# Patient Record
Sex: Male | Born: 1975 | Race: White | Hispanic: No | Smoking: Never smoker
Health system: Southern US, Community
[De-identification: ages and names within clinical notes are randomized; demographics above are authoritative.]

## PROBLEM LIST (undated history)

## (undated) DIAGNOSIS — I10 Essential (primary) hypertension: Secondary | ICD-10-CM

## (undated) DIAGNOSIS — M549 Dorsalgia, unspecified: Secondary | ICD-10-CM

## (undated) DIAGNOSIS — S3992XA Unspecified injury of lower back, initial encounter: Secondary | ICD-10-CM

---

## 2005-04-16 ENCOUNTER — Ambulatory Visit (HOSPITAL_COMMUNITY): Admission: RE | Admit: 2005-04-16 | Discharge: 2005-04-16 | Payer: Self-pay | Admitting: Family Medicine

## 2005-05-23 ENCOUNTER — Ambulatory Visit (HOSPITAL_COMMUNITY): Admission: RE | Admit: 2005-05-23 | Discharge: 2005-05-23 | Payer: Self-pay | Admitting: Family Medicine

## 2006-02-18 ENCOUNTER — Ambulatory Visit (HOSPITAL_COMMUNITY): Admission: RE | Admit: 2006-02-18 | Discharge: 2006-02-18 | Payer: Self-pay | Admitting: Family Medicine

## 2006-04-13 ENCOUNTER — Emergency Department (HOSPITAL_COMMUNITY): Admission: EM | Admit: 2006-04-13 | Discharge: 2006-04-13 | Payer: Self-pay | Admitting: Emergency Medicine

## 2012-07-24 ENCOUNTER — Encounter (HOSPITAL_COMMUNITY): Payer: Self-pay | Admitting: Emergency Medicine

## 2012-07-24 ENCOUNTER — Emergency Department (HOSPITAL_COMMUNITY)
Admission: EM | Admit: 2012-07-24 | Discharge: 2012-07-24 | Disposition: A | Discharge status: Home / Self Care | Payer: Self-pay | Attending: Emergency Medicine | Admitting: Emergency Medicine

## 2012-07-24 DIAGNOSIS — G8929 Other chronic pain: Secondary | ICD-10-CM | POA: Insufficient documentation

## 2012-07-24 DIAGNOSIS — M546 Pain in thoracic spine: Secondary | ICD-10-CM | POA: Insufficient documentation

## 2012-07-24 DIAGNOSIS — Z87828 Personal history of other (healed) physical injury and trauma: Secondary | ICD-10-CM | POA: Insufficient documentation

## 2012-07-24 HISTORY — DX: Dorsalgia, unspecified: M54.9

## 2012-07-24 HISTORY — DX: Unspecified injury of lower back, initial encounter: S39.92XA

## 2012-07-24 MED ORDER — CYCLOBENZAPRINE HCL 10 MG PO TABS: 10.0000 mg | ORAL_TABLET | Freq: Two times a day (BID) | ORAL | Status: DC | PRN

## 2012-07-24 MED ORDER — OXYCODONE-ACETAMINOPHEN 5-325 MG PO TABS: 2.0000 | ORAL_TABLET | ORAL | Status: DC | PRN

## 2012-07-24 MED ORDER — PREDNISONE (PAK) 10 MG PO TABS: 10.0000 mg | ORAL_TABLET | Freq: Every day | ORAL | Status: DC

## 2012-07-24 MED ORDER — PREDNISONE 50 MG PO TABS
60.0000 mg | ORAL_TABLET | Freq: Once | ORAL | Status: AC
  Administered 2012-07-24: 60 mg via ORAL
  Filled 2012-07-24: qty 1

## 2012-07-24 MED ORDER — KETOROLAC TROMETHAMINE 60 MG/2ML IM SOLN
60.0000 mg | Freq: Once | INTRAMUSCULAR | Status: AC
  Administered 2012-07-24: 60 mg via INTRAMUSCULAR
  Filled 2012-07-24: qty 2

## 2012-07-24 MED ORDER — CYCLOBENZAPRINE HCL 10 MG PO TABS
10.0000 mg | ORAL_TABLET | Freq: Once | ORAL | Status: AC
  Administered 2012-07-24: 10 mg via ORAL
  Filled 2012-07-24: qty 1

## 2012-07-24 NOTE — ED Provider Notes (Signed)
History     CSN: [id number]  Arrival date & time 07/24/12  7829   First MD Initiated Contact with Patient 07/24/12 [phone]      Chief Complaint  Patient presents with  . Back Pain    (Consider location/radiation/quality/duration/timing/severity/associated sxs/prior treatment) Patient is a 37 y.o. male presenting with back pain. The history is provided by the patient.  Back Pain Location:  Thoracic spine Quality:  Stabbing, shooting and burning Radiates to:  Does not radiate Pain severity:  Moderate Pain is:  Same all the time Onset quality:  Gradual Duration:  4 days Timing:  Constant Progression:  Worsening Chronicity:  New Relieved by:  Nothing Ineffective treatments:  Ibuprofen, narcotics, lying down, heating pad and being still Associated symptoms: no abdominal pain, no bladder incontinence, no bowel incontinence, no chest pain, no dysuria, no fever, no headaches, no leg pain, no numbness, no tingling, no weakness and no weight loss    Edu BERT PTACEK is a 37 y.o. male who presents to the ED with low back pain. He has a history of chronic back pain. He states he does well for a while and then the pain returns. At one time he had an injection in his back that helped for a long time.   Past Medical History  Diagnosis Date  . Back pain   . Back injury     No past surgical history on file.  No family history on file.  History  Substance Use Topics  . Smoking status: Not on file  . Smokeless tobacco: Not on file  . Alcohol Use: Not on file      Review of Systems  Constitutional: Negative for fever and weight loss.  Cardiovascular: Negative for chest pain.  Gastrointestinal: Negative for nausea, vomiting, abdominal pain and bowel incontinence.  Genitourinary: Negative for bladder incontinence and dysuria.  Musculoskeletal: Positive for back pain.  Skin: Negative for rash.  Neurological: Negative for tingling, weakness, numbness and headaches.   Psychiatric/Behavioral: The patient is not nervous/anxious.     Allergies  Review of patient's allergies indicates no known allergies.  Home Medications   Current Outpatient Rx  Name  Route  Sig  Dispense  Refill  . oxyCODONE-acetaminophen (PERCOCET) 10-325 MG per tablet   Oral   Take 1 tablet by mouth every 4 (four) hours as needed for pain.           BP 149/100  Pulse 84  Temp(Src) 97.7 F (36.5 C) (Oral)  Resp 16  Ht 6' (1.829 m)  Wt 185 lb (83.915 kg)  BMI 25.08 kg/m2  SpO2 100%  Physical Exam  Nursing note and vitals reviewed. Constitutional: He is oriented to person, place, and time. He appears well-developed and well-nourished.  HENT:  Head: Normocephalic and atraumatic.  Eyes: EOM are normal.  Neck: Normal range of motion. Neck supple.  Cardiovascular: Normal rate and regular rhythm.   Pulmonary/Chest: Effort normal and breath sounds normal.  Abdominal: Soft. Bowel sounds are normal. There is no tenderness.  Musculoskeletal: Normal range of motion. He exhibits no edema.       Thoracic back: He exhibits tenderness and spasm. He exhibits normal range of motion.  There is tenderness with palpation of the thoracic area.   Neurological: He is alert and oriented to person, place, and time. He has normal strength. No cranial nerve deficit or sensory deficit.  Reflex Scores:      Bicep reflexes are 2+ on the right side and 2+ on the  left side.      Brachioradialis reflexes are 2+ on the right side and 2+ on the left side.      Patellar reflexes are 2+ on the right side and 2+ on the left side. Pedal pulses strong and equal. Adequate circulation, good touch sensation.  Skin: Skin is warm and dry.  Psychiatric: He has a normal mood and affect.    ED Course  Procedures (including critical care time)  MDM  37 y.o. male with thoracic back pain. Hx of same in the past. I have reviewed this patient's vital signs, nurses notes and discussed clinical findings and plan  of care with the patient. He voices understanding. He will follow up with ortho. He will continue his oxycodone as needed and take the prednisone and flexeril until he sees ortho.    Medication List    STOP taking these medications       acetaminophen 500 MG tablet  Commonly known as:  TYLENOL     ibuprofen 200 MG tablet  Commonly known as:  ADVIL,MOTRIN      TAKE these medications       cyclobenzaprine 10 MG tablet  Commonly known as:  FLEXERIL  Take 1 tablet (10 mg total) by mouth 2 (two) times daily as needed for muscle spasms.     predniSONE 10 MG tablet  Commonly known as:  STERAPRED UNI-PAK  Take 1 tablet (10 mg total) by mouth daily. Starting tomorrow take 5 tablets PO, then the next day take 4 tablets, then 3, 2, 1      ASK your doctor about these medications       diazepam 10 MG tablet  Commonly known as:  VALIUM  Take 10 mg by mouth every 6 (six) hours as needed for anxiety.     oxyCODONE-acetaminophen 10-325 MG per tablet  Commonly known as:  PERCOCET  Take 1 tablet by mouth every 4 (four) hours as needed for pain.            Janne Napoleon, [place] 07/24/12 1029

## 2012-07-24 NOTE — ED Provider Notes (Signed)
Medical screening examination/treatment/procedure(s) were performed by non-physician practitioner and as supervising physician I was immediately available for consultation/collaboration.   Charles B. Bernette Mayers, MD 07/24/12 1256

## 2012-07-24 NOTE — ED Notes (Signed)
States that he is out of his pain medications and his back pain is worse than usual.

## 2012-10-14 ENCOUNTER — Emergency Department (HOSPITAL_COMMUNITY)
Admission: EM | Admit: 2012-10-14 | Discharge: 2012-10-14 | Disposition: A | Discharge status: Home / Self Care | Payer: Self-pay | Attending: Emergency Medicine | Admitting: Emergency Medicine

## 2012-10-14 ENCOUNTER — Encounter (HOSPITAL_COMMUNITY): Payer: Self-pay | Admitting: *Deleted

## 2012-10-14 DIAGNOSIS — R21 Rash and other nonspecific skin eruption: Secondary | ICD-10-CM | POA: Insufficient documentation

## 2012-10-14 DIAGNOSIS — R55 Syncope and collapse: Secondary | ICD-10-CM | POA: Insufficient documentation

## 2012-10-14 DIAGNOSIS — Z87828 Personal history of other (healed) physical injury and trauma: Secondary | ICD-10-CM | POA: Insufficient documentation

## 2012-10-14 DIAGNOSIS — R61 Generalized hyperhidrosis: Secondary | ICD-10-CM | POA: Insufficient documentation

## 2012-10-14 DIAGNOSIS — R51 Headache: Secondary | ICD-10-CM | POA: Insufficient documentation

## 2012-10-14 DIAGNOSIS — R110 Nausea: Secondary | ICD-10-CM | POA: Insufficient documentation

## 2012-10-14 DIAGNOSIS — T7840XA Allergy, unspecified, initial encounter: Secondary | ICD-10-CM

## 2012-10-14 LAB — CBC WITH DIFFERENTIAL/PLATELET
Eosinophils Relative: 1 % (ref 0–5)
HCT: 46 % (ref 39.0–52.0)
Hemoglobin: 15.3 g/dL (ref 13.0–17.0)
Lymphocytes Relative: 5 % — ABNORMAL LOW (ref 12–46)
MCH: 31 pg (ref 26.0–34.0)
Monocytes Relative: 3 % (ref 3–12)
Neutrophils Relative %: 90 % — ABNORMAL HIGH (ref 43–77)
RBC: 4.93 MIL/uL (ref 4.22–5.81)
RDW: 12.5 % (ref 11.5–15.5)
WBC: 15.9 10*3/uL — ABNORMAL HIGH (ref 4.0–10.5)

## 2012-10-14 LAB — BASIC METABOLIC PANEL
BUN: 19 mg/dL (ref 6–23)
CO2: 24 mEq/L (ref 19–32)
Chloride: 102 mEq/L (ref 96–112)
Creatinine, Ser: 1.22 mg/dL (ref 0.50–1.35)
GFR calc non Af Amer: 74 mL/min — ABNORMAL LOW (ref >90)
Glucose, Bld: 116 mg/dL — ABNORMAL HIGH (ref 70–99)
Sodium: 134 mEq/L — ABNORMAL LOW (ref 135–145)

## 2012-10-14 MED ORDER — METHYLPREDNISOLONE SODIUM SUCC 125 MG IJ SOLR
125.0000 mg | Freq: Once | INTRAMUSCULAR | Status: AC
  Administered 2012-10-14: 125 mg via INTRAVENOUS
  Filled 2012-10-14: qty 2

## 2012-10-14 MED ORDER — FAMOTIDINE 20 MG PO TABS: 20.0000 mg | ORAL_TABLET | Freq: Two times a day (BID) | ORAL | Status: DC

## 2012-10-14 MED ORDER — FAMOTIDINE IN NACL 20-0.9 MG/50ML-% IV SOLN
20.0000 mg | Freq: Once | INTRAVENOUS | Status: AC
  Administered 2012-10-14: 20 mg via INTRAVENOUS
  Filled 2012-10-14: qty 50

## 2012-10-14 MED ORDER — PREDNISONE 20 MG PO TABS: ORAL_TABLET | ORAL | Status: DC

## 2012-10-14 MED ORDER — SODIUM CHLORIDE 0.9 % IV BOLUS (SEPSIS)
1000.0000 mL | Freq: Once | INTRAVENOUS | Status: AC
  Administered 2012-10-14: 1000 mL via INTRAVENOUS

## 2012-10-14 MED ORDER — DIPHENHYDRAMINE HCL 50 MG/ML IJ SOLN
25.0000 mg | Freq: Once | INTRAMUSCULAR | Status: AC
  Administered 2012-10-14: 25 mg via INTRAVENOUS
  Filled 2012-10-14: qty 1

## 2012-10-14 NOTE — Progress Notes (Signed)
ED/CM noted pt did not have health insurance, and/or PCP. Patient was given the ED  [facility] uninsured handout  with information for the clinics, food pantries, and the handout for insurance signup.  Patient expressed  appreciation for this.

## 2012-10-14 NOTE — ED Provider Notes (Signed)
CSN: [id number]     Arrival date & time 10/14/12  0703 History  This chart was scribed for Donnetta Hutching, MD, by Yevette Edwards, ED Scribe. This patient was seen in room APA06/APA06 and the patient's care was started at 7:17 AM.   First MD Initiated Contact with Patient 10/14/12 [phone]     Chief Complaint  Patient presents with  . Near Syncope  . Allergic Reaction    The history is provided by the patient and the spouse. No language interpreter was used.   HPI Comments: Derek Holmes is a 37 y.o. male who presents to the Emergency Department complaining of an acute episode of near syncope which occurred suddenly this morning as he was standing at the commode. He experienced nausea and diaphoresis in addition to the syncope. He denies experiencing any pain associated with the near syncope. The pt states that for the past three days, he has also experienced a diffuse rash along with constant itching and swelling to several affected sites including his right thigh, right buttocks, abdomens, legs bilaterally, and hands bilaterally. He states that one of the sites on his right thigh started as a "big boil" with a red ring around it. The pt's wife reports that the rash seems to be spreading and is now also present on his face, scalp, and bilateral arms. The pt works outside, and he states that he has a history of being bitten by insects including ticks. He denies being aware of any recent tick bites, though.  His wife also states that he has had a higher BP than usual for the past two weeks, with the max being 167/107; in the ED his BP was 110/75. They were checking his BP at home because the pt had been experiencing headaches. He has a h/o stress-induced hypertension.   Past Medical History  Diagnosis Date  . Back pain   . Back injury    History reviewed. No pertinent past surgical history. History reviewed. No pertinent family history. History  Substance Use Topics  . Smoking status: Not on file   . Smokeless tobacco: Not on file  . Alcohol Use: Not on file    Review of Systems  Constitutional: Positive for diaphoresis.  Cardiovascular: Negative for chest pain.  Gastrointestinal: Positive for nausea. Negative for abdominal pain.  Genitourinary: Negative for dysuria.  Musculoskeletal: Negative for back pain.  Skin: Positive for rash.  Neurological: Positive for syncope (Pt states it was a "near" syncope) and headaches.  All other systems reviewed and are negative.    Allergies  Review of patient's allergies indicates no known allergies.  Home Medications   Current Outpatient Rx  Name  Route  Sig  Dispense  Refill  . cyclobenzaprine (FLEXERIL) 10 MG tablet   Oral   Take 1 tablet (10 mg total) by mouth 2 (two) times daily as needed for muscle spasms.   20 tablet   0   . diazepam (VALIUM) 10 MG tablet   Oral   Take 10 mg by mouth every 6 (six) hours as needed for anxiety.         Marland Kitchen oxyCODONE-acetaminophen (PERCOCET) 10-325 MG per tablet   Oral   Take 1 tablet by mouth every 4 (four) hours as needed for pain.         Marland Kitchen oxyCODONE-acetaminophen (PERCOCET/ROXICET) 5-325 MG per tablet   Oral   Take 2 tablets by mouth every 4 (four) hours as needed for pain.   15 tablet  0   . predniSONE (STERAPRED UNI-PAK) 10 MG tablet   Oral   Take 1 tablet (10 mg total) by mouth daily. Starting tomorrow take 5 tablets PO, then the next day take 4 tablets, then 3, 2, 1   15 tablet   0    Triage Vitals: BP 110/75  Pulse 85  Temp(Src) 98.7 F (37.1 C) (Oral)  Resp 20  Ht 6' (1.829 m)  Wt 175 lb (79.379 kg)  BMI 23.73 kg/m2  SpO2 98%  Physical Exam  Nursing note and vitals reviewed. Constitutional: He is oriented to person, place, and time. He appears well-developed and well-nourished. No distress.  HENT:  Head: Normocephalic and atraumatic.  Eyes: EOM are normal.  Neck: Neck supple. No tracheal deviation present.  Cardiovascular: Normal rate, regular rhythm and  normal heart sounds.   Pulmonary/Chest: Effort normal and breath sounds normal. No respiratory distress.  Musculoskeletal: Normal range of motion.  Neurological: He is alert and oriented to person, place, and time.  Skin: Skin is warm and dry. Rash noted.  Difusse papular erythematous blotchy rash with some scabbing on right leg.  Psychiatric: He has a normal mood and affect. His behavior is normal.    ED Course   DIAGNOSTIC STUDIES:  Oxygen Saturation is 98% on room air, normal by my interpretation.    COORDINATION OF CARE:  7:27 AM- Discussed treatment plan with patient which includes IV medication, lab work, and imaging. The patient agreed to the plan.   Procedures (including critical care time)  Labs Reviewed  CBC WITH DIFFERENTIAL - Abnormal; Notable for the following:    WBC 15.9 (*)    Neutrophils Relative % 90 (*)    Neutro Abs 14.4 (*)    Lymphocytes Relative 5 (*)    All other components within normal limits  BASIC METABOLIC PANEL - Abnormal; Notable for the following:    Sodium 134 (*)    Glucose, Bld 116 (*)    GFR calc non Af Amer 74 (*)    GFR calc Af Amer 86 (*)    All other components within normal limits   No results found. No diagnosis found.    Date: 10/14/2012  Rate: 79  Rhythm: normal sinus rhythm  QRS Axis: normal  Intervals: normal  ST/T Wave abnormalities: normal  Conduction Disutrbances: none  Narrative Interpretation: unremarkable    MDM  Uncertain etiology of allergic phenomena. EKG and hemoglobin stable.  Patient feels better after IV Solu Medrol, Benadryl, Pepcid. Discharge meds include prednisone taper, Pepcid, daily Claritin.  Patient has primary care followup I personally performed the services described in this documentation, which was scribed in my presence. The recorded information has been reviewed and is accurate.    Donnetta Hutching, MD 10/14/12 1037

## 2012-10-14 NOTE — ED Notes (Signed)
Pt c/o hives, itching all over, swelling to bilateral hands that started 2-3 days ago, states that while he was standing at the commode urinating this am he felt like he was going to pass out, weak, became "sweaty" nauseous.. Denies any pain

## 2014-02-24 ENCOUNTER — Other Ambulatory Visit (HOSPITAL_COMMUNITY): Payer: Self-pay | Admitting: Physician Assistant

## 2014-02-24 DIAGNOSIS — G609 Hereditary and idiopathic neuropathy, unspecified: Secondary | ICD-10-CM

## 2014-02-24 DIAGNOSIS — M5417 Radiculopathy, lumbosacral region: Secondary | ICD-10-CM

## 2014-02-24 DIAGNOSIS — M7541 Impingement syndrome of right shoulder: Secondary | ICD-10-CM

## 2014-03-03 ENCOUNTER — Other Ambulatory Visit (HOSPITAL_COMMUNITY): Payer: Self-pay

## 2014-03-11 ENCOUNTER — Ambulatory Visit (HOSPITAL_COMMUNITY)
Admission: RE | Admit: 2014-03-11 | Discharge: 2014-03-11 | Disposition: A | Discharge status: Home / Self Care | Payer: 59 | Source: Ambulatory Visit | Attending: Physician Assistant | Admitting: Physician Assistant

## 2014-03-11 DIAGNOSIS — M7541 Impingement syndrome of right shoulder: Secondary | ICD-10-CM

## 2014-03-11 DIAGNOSIS — M5417 Radiculopathy, lumbosacral region: Secondary | ICD-10-CM

## 2014-03-11 DIAGNOSIS — M545 Low back pain: Secondary | ICD-10-CM | POA: Diagnosis present

## 2014-03-11 DIAGNOSIS — M79605 Pain in left leg: Secondary | ICD-10-CM | POA: Diagnosis not present

## 2014-03-11 DIAGNOSIS — G609 Hereditary and idiopathic neuropathy, unspecified: Secondary | ICD-10-CM

## 2014-03-11 DIAGNOSIS — M5126 Other intervertebral disc displacement, lumbar region: Secondary | ICD-10-CM | POA: Insufficient documentation

## 2018-04-16 ENCOUNTER — Emergency Department (HOSPITAL_COMMUNITY)
Admission: EM | Admit: 2018-04-16 | Discharge: 2018-04-16 | Disposition: A | Discharge status: Home / Self Care | Payer: Managed Care, Other (non HMO) | Attending: Emergency Medicine | Admitting: Emergency Medicine

## 2018-04-16 ENCOUNTER — Other Ambulatory Visit: Payer: Self-pay

## 2018-04-16 ENCOUNTER — Emergency Department (HOSPITAL_COMMUNITY): Payer: Managed Care, Other (non HMO)

## 2018-04-16 ENCOUNTER — Encounter (HOSPITAL_COMMUNITY): Payer: Self-pay | Admitting: Emergency Medicine

## 2018-04-16 DIAGNOSIS — Y9389 Activity, other specified: Secondary | ICD-10-CM | POA: Insufficient documentation

## 2018-04-16 DIAGNOSIS — Z79899 Other long term (current) drug therapy: Secondary | ICD-10-CM | POA: Diagnosis not present

## 2018-04-16 DIAGNOSIS — S39012A Strain of muscle, fascia and tendon of lower back, initial encounter: Secondary | ICD-10-CM | POA: Diagnosis not present

## 2018-04-16 DIAGNOSIS — X509XXA Other and unspecified overexertion or strenuous movements or postures, initial encounter: Secondary | ICD-10-CM | POA: Diagnosis not present

## 2018-04-16 DIAGNOSIS — S3992XA Unspecified injury of lower back, initial encounter: Secondary | ICD-10-CM | POA: Diagnosis present

## 2018-04-16 DIAGNOSIS — Y999 Unspecified external cause status: Secondary | ICD-10-CM | POA: Insufficient documentation

## 2018-04-16 DIAGNOSIS — Y92007 Garden or yard of unspecified non-institutional (private) residence as the place of occurrence of the external cause: Secondary | ICD-10-CM | POA: Insufficient documentation

## 2018-04-16 DIAGNOSIS — I10 Essential (primary) hypertension: Secondary | ICD-10-CM | POA: Diagnosis not present

## 2018-04-16 HISTORY — DX: Essential (primary) hypertension: I10

## 2018-04-16 MED ORDER — CYCLOBENZAPRINE HCL 10 MG PO TABS: 10.0000 mg | ORAL_TABLET | Freq: Two times a day (BID) | ORAL | 0 refills | Status: AC | PRN

## 2018-04-16 MED ORDER — HYDROCODONE-ACETAMINOPHEN 5-325 MG PO TABS: 1.0000 | ORAL_TABLET | ORAL | 0 refills | Status: AC | PRN

## 2018-04-16 MED ORDER — HYDROMORPHONE HCL 1 MG/ML IJ SOLN
1.0000 mg | Freq: Once | INTRAMUSCULAR | Status: AC
  Administered 2018-04-16: 1 mg via INTRAMUSCULAR
  Filled 2018-04-16: qty 1

## 2018-04-16 MED ORDER — PREDNISONE 10 MG PO TABS
60.0000 mg | ORAL_TABLET | Freq: Once | ORAL | Status: AC
  Administered 2018-04-16: 60 mg via ORAL
  Filled 2018-04-16: qty 1

## 2018-04-16 MED ORDER — PREDNISONE 10 MG PO TABS: ORAL_TABLET | ORAL | 0 refills | Status: AC

## 2018-04-16 MED ORDER — ONDANSETRON 4 MG PO TBDP
4.0000 mg | ORAL_TABLET | Freq: Once | ORAL | Status: AC
  Administered 2018-04-16: 4 mg via ORAL
  Filled 2018-04-16: qty 1

## 2018-04-16 MED ORDER — KETOROLAC TROMETHAMINE 30 MG/ML IJ SOLN
15.0000 mg | Freq: Once | INTRAMUSCULAR | Status: AC
  Administered 2018-04-16: 15 mg via INTRAVENOUS
  Filled 2018-04-16: qty 1

## 2018-04-16 MED ORDER — CYCLOBENZAPRINE HCL 10 MG PO TABS
10.0000 mg | ORAL_TABLET | Freq: Once | ORAL | Status: AC
  Administered 2018-04-16: 10 mg via ORAL
  Filled 2018-04-16: qty 1

## 2018-04-16 MED ORDER — HYDROMORPHONE HCL 1 MG/ML IJ SOLN
1.0000 mg | Freq: Once | INTRAMUSCULAR | Status: AC
  Administered 2018-04-16: 1 mg via INTRAVENOUS
  Filled 2018-04-16: qty 1

## 2018-04-16 NOTE — ED Notes (Signed)
Pt denies injury. Pt does report bending over a lot while using the chainsaw today.

## 2018-04-16 NOTE — Discharge Instructions (Addendum)
Take your next dose of prednisone tomorrow evening.  Use the the other medicines as directed.  Do not drive within 4 hours of taking hydrocodone as this will make you drowsy.  Avoid lifting,  Bending,  Twisting or any other activity that worsens your pain over the next week.  Apply a heating pad to your back for 20 minutes up to 5 times daily for the next several days.  You should get rechecked if your symptoms are not better over the next 5 days,  Or you develop increased pain,  Weakness in your leg(s) or loss of bladder or bowel function - these are symptoms of a worsening condition.  Your xray is normal tonight.

## 2018-04-16 NOTE — ED Notes (Signed)
Pt able to sit on edge of bed.

## 2018-04-16 NOTE — ED Notes (Signed)
Pt standing  

## 2018-04-16 NOTE — ED Triage Notes (Signed)
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 PT states hx of DJD and lower back pain but no history of surgery.

## 2018-04-16 NOTE — ED Provider Notes (Signed)
[facility] EMERGENCY DEPARTMENT Provider Note   CSN: [id number] Arrival date & time: 04/16/18  1524     History   Chief Complaint Chief Complaint  Patient presents with  . Back Pain    HPI Derek Holmes is a 43 y.o. male with a history of htn,  intermittent low back problems and known degenerative disk disease presenting with severe pain in his lower back which started within 2 hours of doing yard work including using a chain saw and machete.  He went inside to eat lunch and when he tried to stand has severe nonradiating pain.  He denies weakness or numbness in his legs and has had no urinary or fecal incontinence or retention.  He has had no treatment prior to arrival. He denies abdominal pain, denies fevers or chills.  He does endorse nausea with positional changes which worsens his pain.  The history is provided by the patient.    Past Medical History:  Diagnosis Date  . Back injury   . Back pain   . Hypertension     There are no active problems to display for this patient.   History reviewed. No pertinent surgical history.      Home Medications    Prior to Admission medications   Medication Sig Start Date End Date Taking? Authorizing Provider  ibuprofen (ADVIL,MOTRIN) 200 MG tablet Take 400 mg by mouth every 6 (six) hours as needed for pain.   Yes [provider]  lisinopril (PRINIVIL,ZESTRIL) 20 MG tablet Take 20 mg by mouth daily.  03/07/18  Yes [provider]  cyclobenzaprine (FLEXERIL) 10 MG tablet Take 1 tablet (10 mg total) by mouth 2 (two) times daily as needed for muscle spasms. 04/16/18   Burgess AmorIdol, Guhan Bruington, PA-C  HYDROcodone-acetaminophen (NORCO/VICODIN) 5-325 MG tablet Take 1 tablet by mouth every 4 (four) hours as needed. 04/16/18   Burgess AmorIdol, Michelangelo Rindfleisch, PA-C  HYDROcodone-acetaminophen (NORCO/VICODIN) 5-325 MG tablet Take 1-2 tablets by mouth every 4 (four) hours as needed. 04/16/18   Burgess AmorIdol, Zaide Mcclenahan, PA-C  predniSONE (DELTASONE) 10 MG tablet Take 6 tablets  day one, 5 tablets day two, 4 tablets day three, 3 tablets day four, 2 tablets day five, then 1 tablet day six 04/16/18   Burgess AmorIdol, Marquette Blodgett, PA-C    Family History History reviewed. No pertinent family history.  Social History Social History   Tobacco Use  . Smoking status: Never Smoker  . Smokeless tobacco: Current User    Types: Snuff  Substance Use Topics  . Alcohol use: Not Currently    Frequency: Never  . Drug use: Never     Allergies   Patient has no known allergies.   Review of Systems Review of Systems  Constitutional: Negative for fever.  Respiratory: Negative for shortness of breath.   Cardiovascular: Negative for chest pain and leg swelling.  Gastrointestinal: Negative for abdominal distention, abdominal pain and constipation.  Genitourinary: Negative for difficulty urinating, dysuria, flank pain, frequency and urgency.  Musculoskeletal: Positive for back pain. Negative for gait problem and joint swelling.  Skin: Negative for rash.  Neurological: Negative for weakness and numbness.     Physical Exam Updated Vital Signs BP 119/72 (BP Location: Right Arm)   Pulse 97   Temp 98.7 F (37.1 C) (Oral)   Resp 20   Ht 6' (1.829 m)   Wt 90.7 kg   SpO2 97%   BMI 27.12 kg/m   Physical Exam Vitals signs and nursing note reviewed.  Constitutional:  Appearance: He is well-developed.  HENT:     Head: Normocephalic.  Eyes:     Conjunctiva/sclera: Conjunctivae normal.  Neck:     Musculoskeletal: Normal range of motion and neck supple.  Cardiovascular:     Rate and Rhythm: Normal rate.     Comments: Pedal pulses normal. Pulmonary:     Effort: Pulmonary effort is normal.  Abdominal:     General: Bowel sounds are normal. There is no distension.     Palpations: Abdomen is soft. There is no mass.  Musculoskeletal: Normal range of motion.     Right shoulder: He exhibits bony tenderness. He exhibits no swelling, no deformity and normal strength.     Lumbar back:  He exhibits tenderness. He exhibits no swelling, no edema and no spasm.  Skin:    General: Skin is warm and dry.  Neurological:     Mental Status: He is alert.     Sensory: No sensory deficit.     Motor: No tremor or atrophy.     Gait: Gait normal.     Deep Tendon Reflexes:     Reflex Scores:      Patellar reflexes are 2+ on the right side and 2+ on the left side.      Achilles reflexes are 2+ on the right side and 2+ on the left side.    Comments: No strength deficit noted in hip and knee flexor and extensor muscle groups.  Ankle flexion and extension intact.      ED Treatments / Results  Labs (all labs ordered are listed, but only abnormal results are displayed) Labs Reviewed - No data to display  EKG None  Radiology Dg Lumbar Spine Complete  Result Date: 04/16/2018 CLINICAL DATA:  Low back pain EXAM: LUMBAR SPINE - COMPLETE 4+ VIEW COMPARISON:  03/11/2014 FINDINGS: There is no evidence of lumbar spine fracture. Alignment is normal. Intervertebral disc spaces are maintained. SI joints symmetric and unremarkable. IMPRESSION: No acute bony abnormality. Electronically Signed   By: Charlett Nose M.D.   On: 04/16/2018 19:42    Procedures Procedures (including critical care time)  Medications Ordered in ED Medications  HYDROmorphone (DILAUDID) injection 1 mg (1 mg Intramuscular Given 04/16/18 1648)  ondansetron (ZOFRAN-ODT) disintegrating tablet 4 mg (4 mg Oral Given 04/16/18 1647)  predniSONE (DELTASONE) tablet 60 mg (60 mg Oral Given 04/16/18 1647)  HYDROmorphone (DILAUDID) injection 1 mg (1 mg Intramuscular Given 04/16/18 1734)  HYDROmorphone (DILAUDID) injection 1 mg (1 mg Intravenous Given 04/16/18 1832)  cyclobenzaprine (FLEXERIL) tablet 10 mg (10 mg Oral Given 04/16/18 1833)  ketorolac (TORADOL) 30 MG/ML injection 15 mg (15 mg Intravenous Given 04/16/18 1949)     Initial Impression / Assessment and Plan / ED Course  I have reviewed the triage vital signs and the nursing  notes.  Pertinent labs & imaging results that were available during my care of the patient were reviewed by me and considered in my medical decision making (see chart for details).     Patient was given Dilaudid injection 1 mg along with oral prednisone with no improvement in pain.  Repeated Dilaudid 1 mg IM x1, again patient with severe pain and unable to stand, was barely able to tolerate sitting on the side of the bed.  An IV was started and he was given a third dose of Dilaudid IV in addition to Toradol.  After these 2 medications he was able to stand, still with discomfort but much more comfortable.  Slightly antalgic gait  but without neuro deficits.  Discussed home treatments including activity as tolerated, heat therapy.  Discussed strict return precautions.  Plan follow-up with his PCP for recheck if not significantly improved over the next 5 days.  Imaging reviewed and discussed with patient and family.  Final Clinical Impressions(s) / ED Diagnoses   Final diagnoses:  Strain of lumbar region, initial encounter    ED Discharge Orders         Ordered    HYDROcodone-acetaminophen (NORCO/VICODIN) 5-325 MG tablet  Every 4 hours PRN     04/16/18 2030    cyclobenzaprine (FLEXERIL) 10 MG tablet  2 times daily PRN     04/16/18 2030    predniSONE (DELTASONE) 10 MG tablet     04/16/18 2030    HYDROcodone-acetaminophen (NORCO/VICODIN) 5-325 MG tablet  Every 4 hours PRN     04/16/18 2038           Victoriano Lain 04/16/18 2131    Bethann Berkshire, MD 04/17/18 1635

## 2018-04-17 MED FILL — Hydrocodone-Acetaminophen Tab 5-325 MG: ORAL | Qty: 6 | Status: AC

## 2020-11-09 ENCOUNTER — Encounter: Payer: Self-pay | Admitting: Internal Medicine

## 2020-12-24 IMAGING — DX DG LUMBAR SPINE COMPLETE 4+V
5 series · 5 of 5 positions shown · non-contrast
Comparison: 03/11/2014

CLINICAL DATA: Low back pain

EXAM:
LUMBAR SPINE - COMPLETE 4+ VIEW

[l-spine ap]
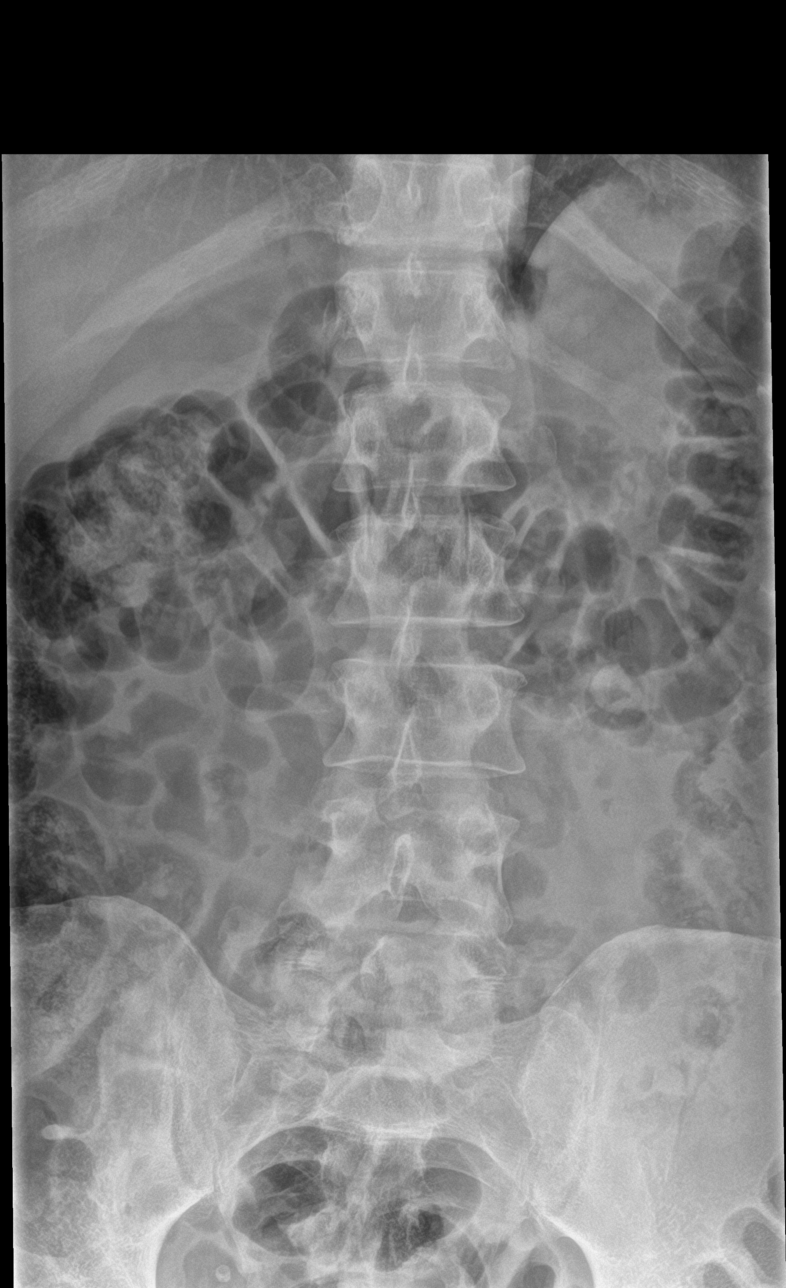

[l-spine obl (1 of 2)]
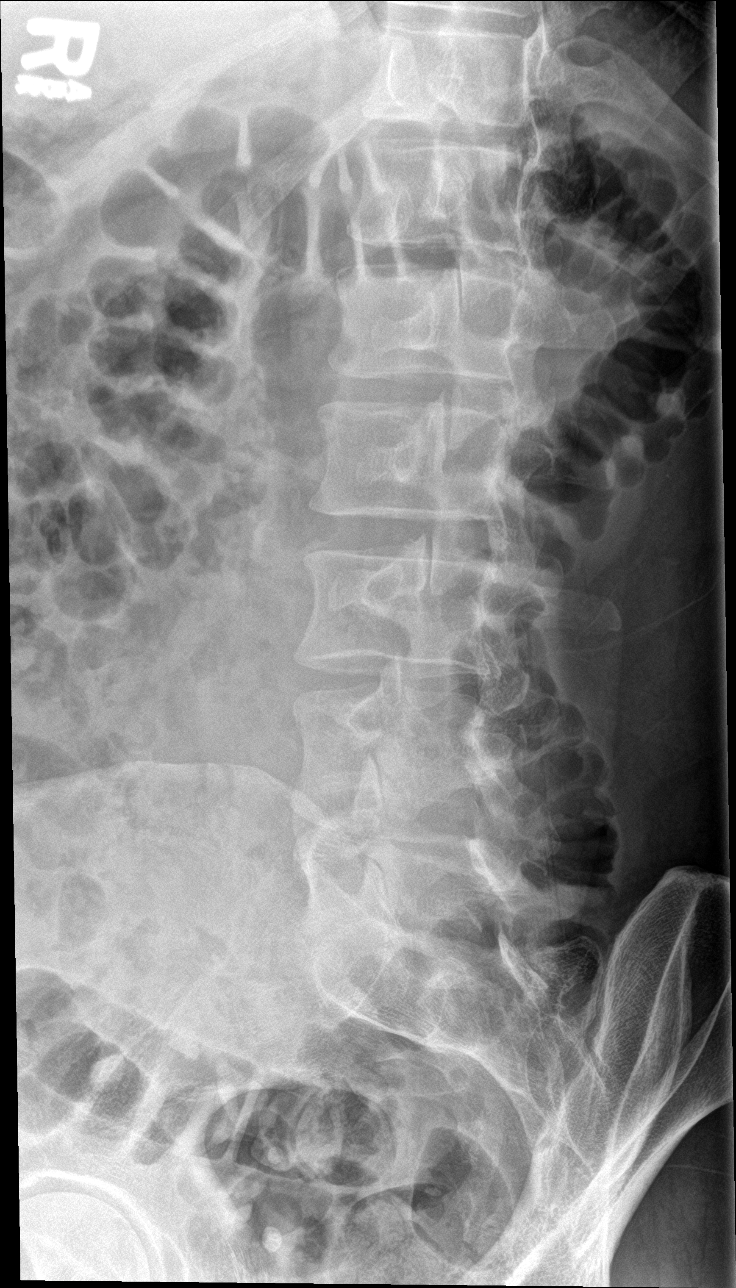

[l-spine obl (2 of 2)]
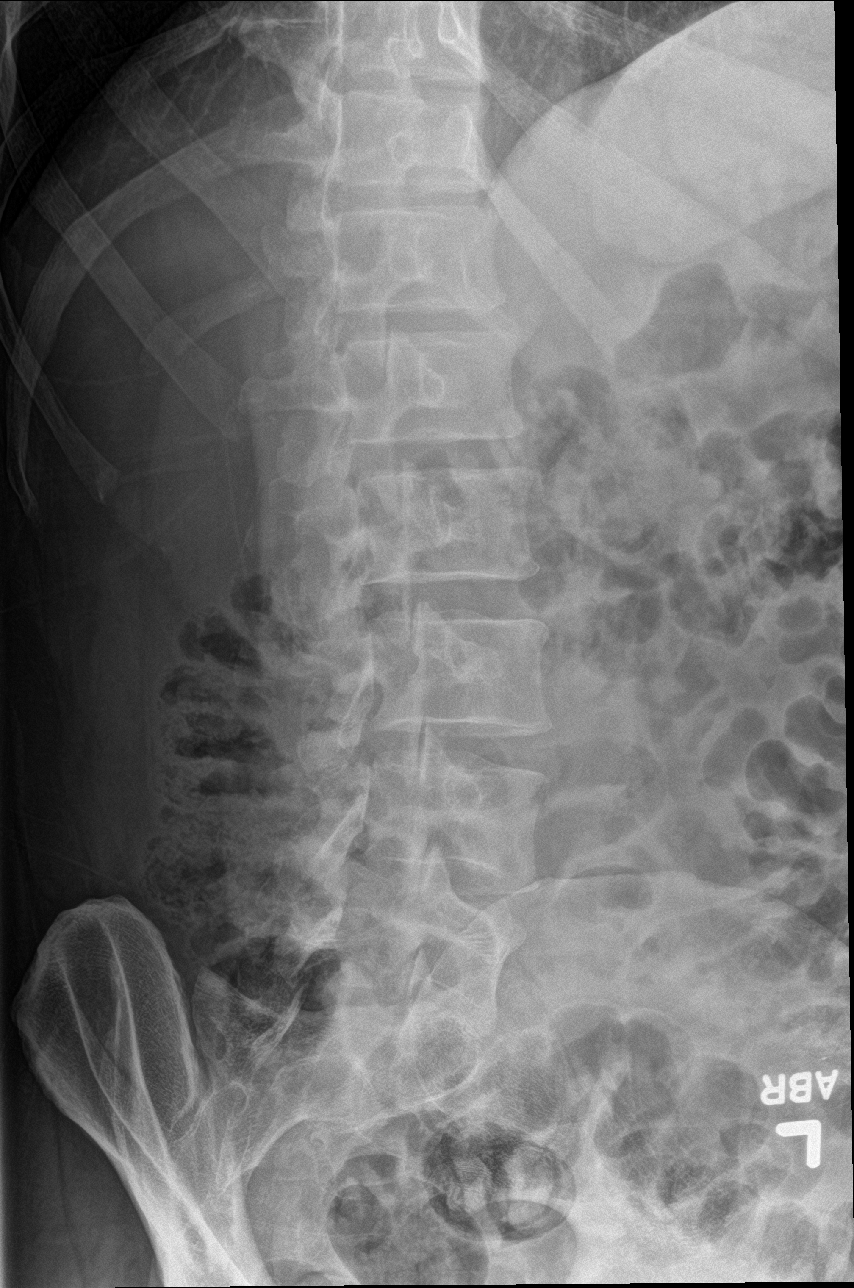

[l-spine lat]
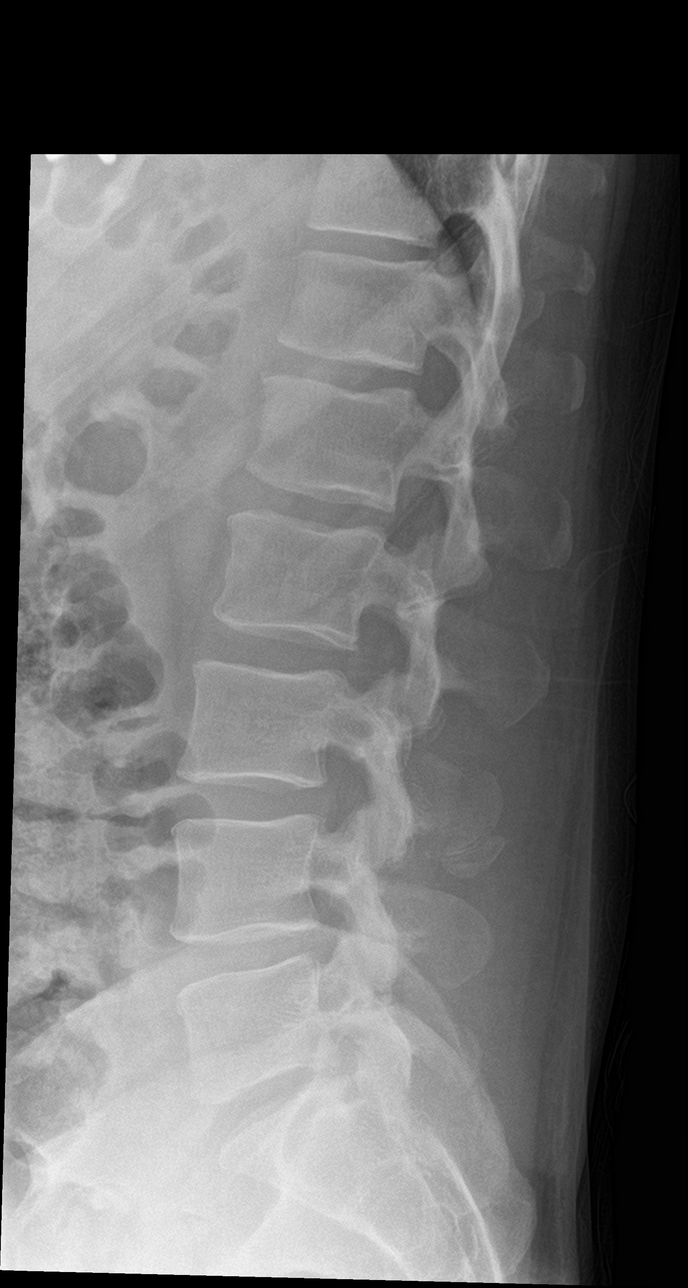

[l-spine spot]
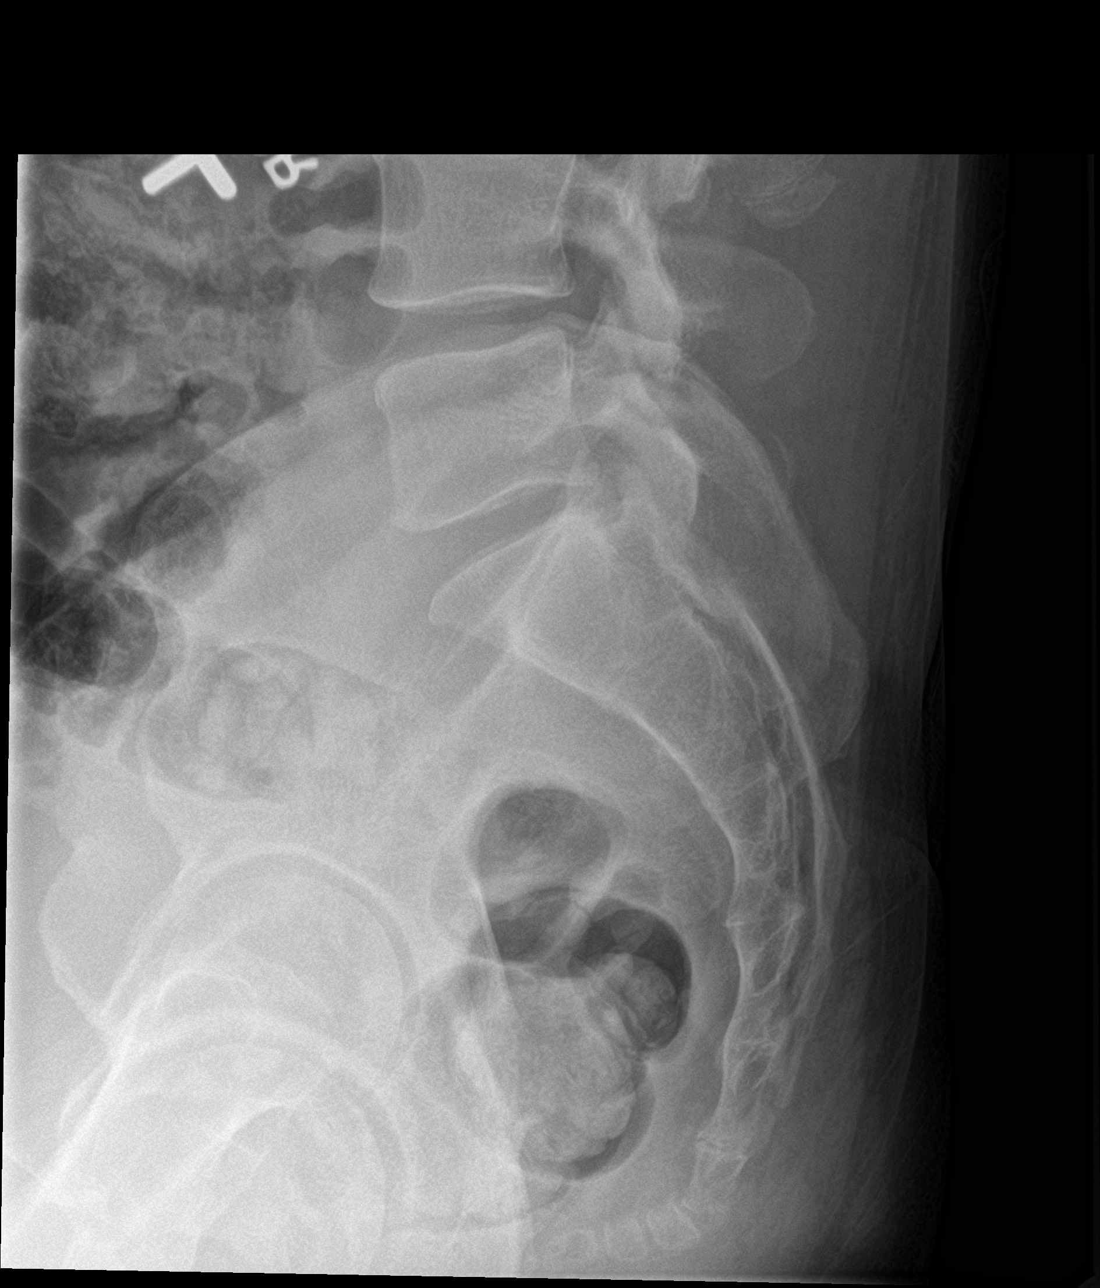

[5 of 5 positions shown; findings below may reference images not displayed]

FINDINGS: There is no evidence of lumbar spine fracture. Alignment is normal.
Intervertebral disc spaces are maintained. SI joints symmetric and
unremarkable.
IMPRESSION: No acute bony abnormality.

## 2020-12-29 ENCOUNTER — Ambulatory Visit: Payer: Managed Care, Other (non HMO)

## 2024-02-05 ENCOUNTER — Ambulatory Visit: Attending: Physical Therapy | Admitting: Physical Therapy

## 2024-02-05 NOTE — Therapy (Deleted)
 OUTPATIENT PHYSICAL THERAPY THORACOLUMBAR EVALUATION   Patient Name: Derek Holmes MRN: [id number] DOB:1976-01-20, 48 y.o., male Today's Date: 02/05/2024  END OF SESSION:   Past Medical History:  Diagnosis Date   Back injury    Back pain    Hypertension    No past surgical history on file. There are no active problems to display for this patient.   PCP: Medicine, [facility] (Inactive)  REFERRING PROVIDER: Candance Jeoffrey LOISE DEVONNA  REFERRING DIAG: [phone] (ICD-10-CM) - Encounter for other specified surgical aftercare  Rationale for Evaluation and Treatment: Rehabilitation  THERAPY DIAG:  No diagnosis found.  ONSET DATE: S/P right L4-L5 decompresion/discetomy  SUBJECTIVE:                                                                                                                                                                                           SUBJECTIVE STATEMENT: ***  PERTINENT HISTORY:  ***  PAIN:  Are you having pain? {OPRCPAIN:27236}  PRECAUTIONS: {Therapy precautions:24002}  RED FLAGS: {PT Red Flags:29287}   WEIGHT BEARING RESTRICTIONS: {Yes ***/No:24003}  FALLS:  Has patient fallen in last 6 months? {fallsyesno:27318}  LIVING ENVIRONMENT: Lives with: {OPRC lives with:25569::lives with their family} Lives in: {Lives in:25570} Stairs: {opstairs:27293} Has following equipment at home: {Assistive devices:23999}  OCCUPATION: ***  PLOF: {PLOF:24004}  PATIENT GOALS: ***  NEXT MD VISIT: ***  OBJECTIVE:  Note: Objective measures were completed at Evaluation unless otherwise noted.  DIAGNOSTIC FINDINGS:  ***  PATIENT SURVEYS:  {rehab surveys:24030}  COGNITION: Overall cognitive status: {cognition:24006}     SENSATION: {sensation:27233}  MUSCLE LENGTH: Hamstrings: Right *** deg; Left *** deg Debby test: Right *** deg; Left *** deg  POSTURE: {posture:25561}  PALPATION: ***  LUMBAR ROM:   AROM eval   Flexion   Extension   Right lateral flexion   Left lateral flexion   Right rotation   Left rotation    (Blank rows = not tested)  LOWER EXTREMITY ROM:     {AROM/PROM:27142}  Right eval Left eval  Hip flexion    Hip extension    Hip abduction    Hip adduction    Hip internal rotation    Hip external rotation    Knee flexion    Knee extension    Ankle dorsiflexion    Ankle plantarflexion    Ankle inversion    Ankle eversion     (Blank rows = not tested)  LOWER EXTREMITY MMT:    MMT Right eval Left eval  Hip flexion    Hip extension    Hip abduction    Hip adduction  Hip internal rotation    Hip external rotation    Knee flexion    Knee extension    Ankle dorsiflexion    Ankle plantarflexion    Ankle inversion    Ankle eversion     (Blank rows = not tested)  LUMBAR SPECIAL TESTS:  {lumbar special test:25242}  FUNCTIONAL TESTS:  {Functional tests:24029}  GAIT: Distance walked: *** Assistive device utilized: {Assistive devices:23999} Level of assistance: {Levels of assistance:24026} Comments: ***  TREATMENT DATE: ***                                                                                                                                 PATIENT EDUCATION:  Education details: *** Person educated: {Person educated:25204} Education method: {Education Method:25205} Education comprehension: {Education Comprehension:25206}  HOME EXERCISE PROGRAM: ***  ASSESSMENT:  CLINICAL IMPRESSION: Patient is a *** y.o. *** who was seen today for physical therapy evaluation and treatment for ***.   OBJECTIVE IMPAIRMENTS: {opptimpairments:25111}.   ACTIVITY LIMITATIONS: {activitylimitations:27494}  PARTICIPATION LIMITATIONS: {participationrestrictions:25113}  PERSONAL FACTORS: {Personal factors:25162} are also affecting patient's functional outcome.   REHAB POTENTIAL: {rehabpotential:25112}  CLINICAL DECISION MAKING: {clinical decision  making:25114}  EVALUATION COMPLEXITY: {Evaluation complexity:25115}   GOALS: Goals reviewed with patient? {yes/no:20286}  SHORT TERM GOALS: Target date: ***  *** Baseline: Goal status: INITIAL  2.  *** Baseline:  Goal status: INITIAL  3.  *** Baseline:  Goal status: INITIAL  4.  *** Baseline:  Goal status: INITIAL  5.  *** Baseline:  Goal status: INITIAL  6.  *** Baseline:  Goal status: INITIAL  LONG TERM GOALS: Target date: ***  *** Baseline:  Goal status: INITIAL  2.  *** Baseline:  Goal status: INITIAL  3.  *** Baseline:  Goal status: INITIAL  4.  *** Baseline:  Goal status: INITIAL  5.  *** Baseline:  Goal status: INITIAL  6.  *** Baseline:  Goal status: INITIAL  PLAN:  PT FREQUENCY: {rehab frequency:25116}  PT DURATION: {rehab duration:25117}  PLANNED INTERVENTIONS: {rehab planned interventions:25118::97110-Therapeutic exercises,97530- Therapeutic [phone]- Neuromuscular re-education,97535- Self Rjmz,02859- Manual therapy,Patient/Family education}.  PLAN FOR NEXT SESSION: ***   Zeki Bedrosian April Ma L Tahj Njoku, PT, DPT 02/05/2024, 9:25 AM
